# Patient Record
Sex: Male | Born: 1950 | Race: Black or African American | Hispanic: No | Marital: Married
Health system: Southern US, Community
[De-identification: ages and names within clinical notes are randomized; demographics above are authoritative.]

---

## 2009-08-09 ENCOUNTER — Ambulatory Visit: Payer: Self-pay | Admitting: Family Medicine

## 2009-08-09 DIAGNOSIS — S61409A Unspecified open wound of unspecified hand, initial encounter: Secondary | ICD-10-CM | POA: Insufficient documentation

## 2010-07-15 NOTE — Assessment & Plan Note (Signed)
Summary: L Hand laceration x 1 dy proced m   Vital Signs:  Patient Profile:   60 Years Old Male CC:      L Hand - cut by dog's canine 08/08/09 Height:     73 inches Weight:      267 pounds O2 Sat:      100 % O2 treatment:    Room Air Temp:     97.7 degrees F oral Pulse rate:   63 / minute Pulse rhythm:   regular Resp:     16 per minute BP sitting:   168 / 102  (right arm) Cuff size:   large  Vitals Entered By: Areta Haber CMA (August 09, 2009 6:10 PM)                  Current Allergies: No known allergies History of Present Illness Chief Complaint: L Hand - cut by dog's canine 08/08/09 History of Present Illness: Subjective:  Patient complains of dog bite to left hand palm yesterday; dog not ill and has been immunized.  Does not remember last Td.  Current Problems: DOG BITE (ICD-E906.0) LACERATION, HAND, LEFT (ICD-882.0)   Current Meds AUGMENTIN 875-125 MG TABS (AMOXICILLIN-POT CLAVULANATE) One by mouth q12hr pc  REVIEW OF SYSTEMS Constitutional Symptoms      Denies fever, chills, night sweats, weight loss, weight gain, and fatigue.  Eyes       Denies change in vision, eye pain, eye discharge, glasses, contact lenses, and eye surgery. Ear/Nose/Throat/Mouth       Denies hearing loss/aids, change in hearing, ear pain, ear discharge, dizziness, frequent runny nose, frequent nose bleeds, sinus problems, sore throat, hoarseness, and tooth pain or bleeding.  Respiratory       Denies dry cough, productive cough, wheezing, shortness of breath, asthma, bronchitis, and emphysema/COPD.  Cardiovascular       Denies murmurs, chest pain, and tires easily with exhertion.    Gastrointestinal       Denies stomach pain, nausea/vomiting, diarrhea, constipation, blood in bowel movements, and indigestion. Genitourniary       Denies painful urination, kidney stones, and loss of urinary control. Neurological       Denies paralysis, seizures, and  fainting/blackouts. Musculoskeletal       Denies muscle pain, joint pain, joint stiffness, decreased range of motion, redness, swelling, muscle weakness, and gout.  Skin       Denies bruising, unusual mles/lumps or sores, and hair/skin or nail changes.  Psych       Denies mood changes, temper/anger issues, anxiety/stress, speech problems, depression, and sleep problems. Other Comments: Laceration - L hand  x 1 dy - dog ripped at hand going for plastic on laundry.  Pt states that dog has all updated vaccinations including Rabies.   Past History:  Past Medical History: Unremarkable  Past Surgical History: Denies surgical history  Social History: Married Never Smoked Alcohol use-no Drug use-no Regular exercise-no Smoking Status:  never Drug Use:  no Does Patient Exercise:  no   Objective:  No acute distress  Left Hand:  1.5cm simple laceration over thenar eminence.  Wound is clean without debris.  Skin edges remain apposed.  No drainage, swelling, erythema, or tenderness.  Full range of motion thumb and all fingers.  Distal neurovascular intact  Assessment New Problems: DOG BITE (ICD-E906.0) LACERATION, HAND, LEFT (ICD-882.0)  DOG BITE LACERATION, NO EVIDENCE INFECTION  Plan New Medications/Changes: AUGMENTIN 875-125 MG TABS (AMOXICILLIN-POT CLAVULANATE) One by mouth q12hr pc  #14 x  0, 08/09/2009, Donna Christen MD  New Orders: New Patient Level III [phone] Tdap => 35yrs IM [90715] Admin 1st Vaccine [90471] Admin 1st Vaccine [facility]) [phone] Planning Comments:   Tdap given.  Bacitracin and bandage applied to wound.  Begin prophylactic Augmentin.  Apply bacitracin and change bandage daily until healed. Return for signs of infection   The patient and/or caregiver has been counseled thoroughly with regard to medications prescribed including dosage, schedule, interactions, rationale for use, and possible side effects and they verbalize understanding.  Diagnoses and  expected course of recovery discussed and will return if not improved as expected or if the condition worsens. Patient and/or caregiver verbalized understanding.  Prescriptions: AUGMENTIN 875-125 MG TABS (AMOXICILLIN-POT CLAVULANATE) One by mouth q12hr pc  #14 x 0   Entered and Authorized by:   Donna Christen MD   Signed by:   Donna Christen MD on 08/09/2009   Method used:   Print then Give to Patient   RxID:   [id number]    Orders Added: 1)  New Patient Level III [29528] 2)  Tdap => 16yrs IM [90715] 3)  Admin 1st Vaccine [90471] 4)  Admin 1st Vaccine [facility]) [41324M]    Tetanus/Td Vaccine    Vaccine Type: Tdap    Site: left deltoid    Mfr: Gl[employer]oSmith[employer]    Dose: 0.5 ml    Route: IM    Given by: Areta Haber CMA    Exp. Date: 08/10/2011    Lot #: [id number]    VIS given: 05/03/07 version given August 09, 2009.

## 2018-07-14 ENCOUNTER — Other Ambulatory Visit: Payer: Self-pay

## 2018-07-14 ENCOUNTER — Emergency Department: Payer: 59

## 2018-07-14 ENCOUNTER — Emergency Department
Admission: EM | Admit: 2018-07-14 | Discharge: 2018-07-14 | Disposition: A | Discharge status: Other Acute Inpatient Hospital | Payer: 59 | Attending: Emergency Medicine | Admitting: Emergency Medicine

## 2018-07-14 ENCOUNTER — Encounter: Payer: Self-pay | Admitting: Emergency Medicine

## 2018-07-14 DIAGNOSIS — Y999 Unspecified external cause status: Secondary | ICD-10-CM | POA: Diagnosis not present

## 2018-07-14 DIAGNOSIS — Y929 Unspecified place or not applicable: Secondary | ICD-10-CM | POA: Diagnosis not present

## 2018-07-14 DIAGNOSIS — S6992XA Unspecified injury of left wrist, hand and finger(s), initial encounter: Secondary | ICD-10-CM | POA: Diagnosis present

## 2018-07-14 DIAGNOSIS — W278XXA Contact with other nonpowered hand tool, initial encounter: Secondary | ICD-10-CM | POA: Insufficient documentation

## 2018-07-14 DIAGNOSIS — S61211A Laceration without foreign body of left index finger without damage to nail, initial encounter: Secondary | ICD-10-CM | POA: Diagnosis not present

## 2018-07-14 DIAGNOSIS — Y939 Activity, unspecified: Secondary | ICD-10-CM | POA: Diagnosis not present

## 2018-07-14 DIAGNOSIS — S62651B Nondisplaced fracture of medial phalanx of left index finger, initial encounter for open fracture: Secondary | ICD-10-CM | POA: Diagnosis not present

## 2018-07-14 LAB — COMPREHENSIVE METABOLIC PANEL
ALT: 60 U/L — AB (ref 0–44)
AST: 39 U/L (ref 15–41)
Albumin: 4.6 g/dL (ref 3.5–5.0)
Alkaline Phosphatase: 67 U/L (ref 38–126)
Anion gap: 7 (ref 5–15)
BUN: 12 mg/dL (ref 8–23)
CO2: 29 mmol/L (ref 22–32)
CREATININE: 1.17 mg/dL (ref 0.61–1.24)
Calcium: 9.1 mg/dL (ref 8.9–10.3)
Chloride: 101 mmol/L (ref 98–111)
GFR calc Af Amer: >60 mL/min (ref >60)
GFR calc non Af Amer: >60 mL/min (ref >60)
Glucose, Bld: 108 mg/dL — ABNORMAL HIGH (ref 70–99)
Potassium: 3.7 mmol/L (ref 3.5–5.1)
Sodium: 137 mmol/L (ref 135–145)
Total Bilirubin: 0.6 mg/dL (ref 0.3–1.2)
Total Protein: 7.7 g/dL (ref 6.5–8.1)

## 2018-07-14 LAB — CBC WITH DIFFERENTIAL/PLATELET
Abs Immature Granulocytes: 0.02 K/uL (ref 0.00–0.07)
Basophils Absolute: 0.1 K/uL (ref 0.0–0.1)
Basophils Relative: 1 %
Eosinophils Absolute: 0.3 K/uL (ref 0.0–0.5)
Eosinophils Relative: 3 %
HCT: 48.8 % (ref 39.0–52.0)
Hemoglobin: 16.4 g/dL (ref 13.0–17.0)
Immature Granulocytes: 0 %
Lymphocytes Relative: 35 %
Lymphs Abs: 3.3 K/uL (ref 0.7–4.0)
MCH: 29.1 pg (ref 26.0–34.0)
MCHC: 33.6 g/dL (ref 30.0–36.0)
MCV: 86.5 fL (ref 80.0–100.0)
Monocytes Absolute: 0.9 K/uL (ref 0.1–1.0)
Monocytes Relative: 9 %
Neutro Abs: 4.9 K/uL (ref 1.7–7.7)
Neutrophils Relative %: 52 %
Platelets: 280 K/uL (ref 150–400)
RBC: 5.64 MIL/uL (ref 4.22–5.81)
RDW: 13.2 % (ref 11.5–15.5)
WBC: 9.4 K/uL (ref 4.0–10.5)
nRBC: 0 % (ref 0.0–0.2)

## 2018-07-14 MED ORDER — SODIUM CHLORIDE 0.9 % IV BOLUS: 1000.0000 mL | Freq: Once | INTRAVENOUS | Status: DC

## 2018-07-14 MED ORDER — CLINDAMYCIN PHOSPHATE 600 MG/50ML IV SOLN
600.0000 mg | Freq: Once | INTRAVENOUS | Status: AC
  Administered 2018-07-14: 600 mg via INTRAVENOUS
  Filled 2018-07-14: qty 50

## 2018-07-14 MED ORDER — CLINDAMYCIN PHOSPHATE 600 MG/4ML IJ SOLN
600.0000 mg | Freq: Once | INTRAMUSCULAR | Status: DC
  Filled 2018-07-14: qty 4

## 2018-07-14 NOTE — ED Provider Notes (Signed)
Patient with laceration of left hand secondary to ax.  Was accidental.  Patient to be transferred to [facility] hand.  I evaluated the patient and his bleeding appears controlled he is comfortable.  He is understanding the plan as well as diagnosis and willing to comply.   Myrna Blazer, MD 07/14/18 [phone]

## 2018-07-14 NOTE — ED Provider Notes (Signed)
[facility] Emergency Department Provider Note  ____________________________________________  Time seen: Approximately 7:43 PM  I have reviewed the triage vital signs and the nursing notes.   HISTORY  Chief Complaint Extremity Laceration    HPI Dakota Elliott is a 68 y.o. male who presents emergency department complaining of injury to the left index finger from an ax.  Patient was holding a block with attempting to cut it with an ax when he missed striking his hand/finger with a ax blade.  Patient sustained a significant laceration to the digit.  Patient reports he has had uncontrolled bleeding.  He initially presented to urgent care who advised him to come straight to the emergency department.  Patient does have a pressure dressing applied with no bleedthrough.  Patient is able to flex and extend the digit after injury.  Patient's tetanus shot is up-to-date.  No other injury or complaint at this time.    History reviewed. No pertinent past medical history.  Patient Active Problem List   Diagnosis Date Noted  . LACERATION, HAND, LEFT 08/09/2009    History reviewed. No pertinent surgical history.  Prior to Admission medications   Not on File    Allergies Patient has no allergy information on record.  No family history on file.  Social History Social History   Tobacco Use  . Smoking status: Not on file  Substance Use Topics  . Alcohol use: Not on file  . Drug use: Not on file     Review of Systems  Constitutional: No fever/chills Eyes: No visual changes.  Cardiovascular: no chest pain. Respiratory: no cough. No SOB. Gastrointestinal: No abdominal pain.  No nausea, no vomiting.  Musculoskeletal: Injury to left index finger from an ax Skin: Negative for rash, abrasions, lacerations, ecchymosis. Neurological: Negative for headaches, focal weakness or numbness. 10-point ROS otherwise  negative.  ____________________________________________   PHYSICAL EXAM:  VITAL SIGNS: ED Triage Vitals  Enc Vitals Group     BP 07/14/18 1922 (!) 190/105     Pulse Rate 07/14/18 1920 81     Resp 07/14/18 1920 18     Temp 07/14/18 1920 98.2 F (36.8 C)     Temp Source 07/14/18 1920 Oral     SpO2 07/14/18 1920 98 %     Weight 07/14/18 1918 250 lb (113.4 kg)     Height 07/14/18 1918 6\' 1"  (1.854 m)     Head Circumference --      Peak Flow --      Pain Score 07/14/18 1929 2     Pain Loc --      Pain Edu? --      Excl. in GC? --      Constitutional: Alert and oriented. Well appearing and in no acute distress. Eyes: Conjunctivae are normal. PERRL. EOMI. Head: Atraumatic.  Neck: No stridor.    Cardiovascular: Normal rate, regular rhythm. Normal S1 and S2.  Good peripheral circulation. Respiratory: Normal respiratory effort without tachypnea or retractions. Lungs CTAB. Good air entry to the bases with no decreased or absent breath sounds. Musculoskeletal: Full range of motion to all extremities. No gross deformities appreciated.  Visualization of the left hand reveals significant soft tissue injury to the lateral aspect of the index finger.  Extensor tendon is not visualized, bony structure is visualized.  On exam, patient is missing epithelial and subcutaneous tissue on the proximal aspect of the digit.  Patient does have a flap laceration at this time, however, flap does not cover  entire surface area.  Patient is able to flex and extend the digit appropriately at this time.  Heavy, apparent arterial bleeding is present.  This is able to be controlled with finger tourniquet.  Sensation intact distally. Neurologic:  Normal speech and language. No gross focal neurologic deficits are appreciated.  Skin:  Skin is warm, dry and intact. No rash noted. Psychiatric: Mood and affect are normal. Speech and behavior are normal. Patient exhibits appropriate insight and  judgement.     ____________________________________________   LABS (all labs ordered are listed, but only abnormal results are displayed)  Labs Reviewed  COMPREHENSIVE METABOLIC PANEL - Abnormal; Notable for the following components:      Result Value   Glucose, Bld 108 (*)    ALT 60 (*)    All other components within normal limits  CBC WITH DIFFERENTIAL/PLATELET   ____________________________________________  EKG   ____________________________________________  RADIOLOGY I personally viewed and evaluated these images as part of my medical decision making, as well as reviewing the written report by the radiologist.  I concur with radiologist finding.  Dg Hand Complete Left  Result Date: 07/14/2018 CLINICAL DATA:  Patient struck left index finger with an Axe. Bandage is present. EXAM: LEFT HAND - COMPLETE 3+ VIEW COMPARISON:  None. FINDINGS: Comminuted mostly longitudinal crush fractures of the proximal and midshaft of the middle phalanx left second finger. Fracture lines extend to the articular surface at the proximal interphalangeal joint. Minimal displacement of fracture fragments. Associated soft tissue swelling. Degenerative changes in the interphalangeal joints, first metacarpal phalangeal joint, and STT joints. No expansile or destructive bone lesions. IMPRESSION: Comminuted fractures of the middle phalanx of the left second finger with extension to the proximal interphalangeal joint. Electronically Signed   By: Burman Nieves M.D.   On: 07/14/2018 20:12    ____________________________________________    PROCEDURES  Procedure(s) performed:    Procedures    Medications  sodium chloride 0.9 % bolus 1,000 mL (1,000 mLs Intravenous Bolus 07/14/18 2018)  clindamycin (CLEOCIN) IVPB 600 mg (0 mg Intravenous Stopped 07/14/18 2047)     ____________________________________________   INITIAL IMPRESSION / ASSESSMENT AND PLAN / ED COURSE  Pertinent labs & imaging  results that were available during my care of the patient were reviewed by me and considered in my medical decision making (see chart for details).  Review of the [place] CSRS was performed in accordance of the [facility] prior to dispensing any controlled drugs.      Patient's diagnosis is consistent with laceration with open nondisplaced fracture of the middle phalanx to the second digit of the left hand.  Patient presented to the emergency department after striking his hand with an ax.  Patient sustained a significant soft tissue injury to the second digit.  There was missing tissue identified on exam.  Patient did have an arterial injury but this was controlled direct pressure and elevation.  Given nature of injury, I did discuss the case with on-call orthopedic surgeon who advises that he is not able to manage this and he needed hand surgery.  I contacted [facility] and they are agreeable to transfer for hand surgery.  Patient will be transported by EMS for ED to ED transfer for hand surgery evaluation.  Patient was given IV clindamycin prophylactically.  Patient is kept n.p.o. at this time.  Wound is dressed and stable prior to transfer.  Patient did have bleedthrough of the first dressing placed in the emergency department.  Dressing was reapplied  with no bleedthrough on the pressure dressing at time of transfer..  Patient care will be transferred to [facility] for transfer to [facility] emergency department.    ____________________________________________  FINAL CLINICAL IMPRESSION(S) / ED DIAGNOSES  Final diagnoses:  Laceration of left index finger without foreign body without damage to nail, initial encounter  Open nondisplaced fracture of middle phalanx of left index finger, initial encounter      NEW MEDICATIONS STARTED DURING THIS VISIT:  ED Discharge Orders    None          This chart was dictated using voice recognition  software/Dragon. Despite best efforts to proofread, errors can occur which can change the meaning. Any change was purely unintentional.    Racheal Patches, PA-C 07/14/18 2131    Myrna Blazer, MD 07/14/18 [phone]

## 2018-07-14 NOTE — ED Notes (Signed)
Pt resting on stretcher with family at the bedside. Has no complaints at this time. Pt has been informed that he will be transferred to [facility] and that he is waiting for transport. Verbalized understanding.

## 2018-07-14 NOTE — ED Notes (Signed)
Pt cut left index finger with axe.

## 2018-07-14 NOTE — ED Notes (Signed)
Pt up to restroom with steady gait. No distress noted.  

## 2018-07-14 NOTE — ED Triage Notes (Addendum)
Pt arrived with concerns over laceration to left finger. Bandaged currently over finger and bleeding contained. Family and pt ask for dressing not to be removed until with a provider. Bleeding contained with bandage

## 2018-07-14 NOTE — ED Notes (Signed)
Accepted to [facility] (ED>ED)

## 2018-07-14 NOTE — ED Notes (Signed)
[facility] reviewed , paper consent for transfer completed

## 2020-06-13 IMAGING — CR DG HAND COMPLETE 3+V*L*
1 series · 3 of 3 positions shown · non-contrast
Comparison: None.

CLINICAL DATA: Patient struck left index finger with an Axe.
Bandage is present.

EXAM:
LEFT HAND - COMPLETE 3+ VIEW

[Series 1: x hand pa left · 0.14mm/px · 3 of 3 slices shown]
[im 1/3]
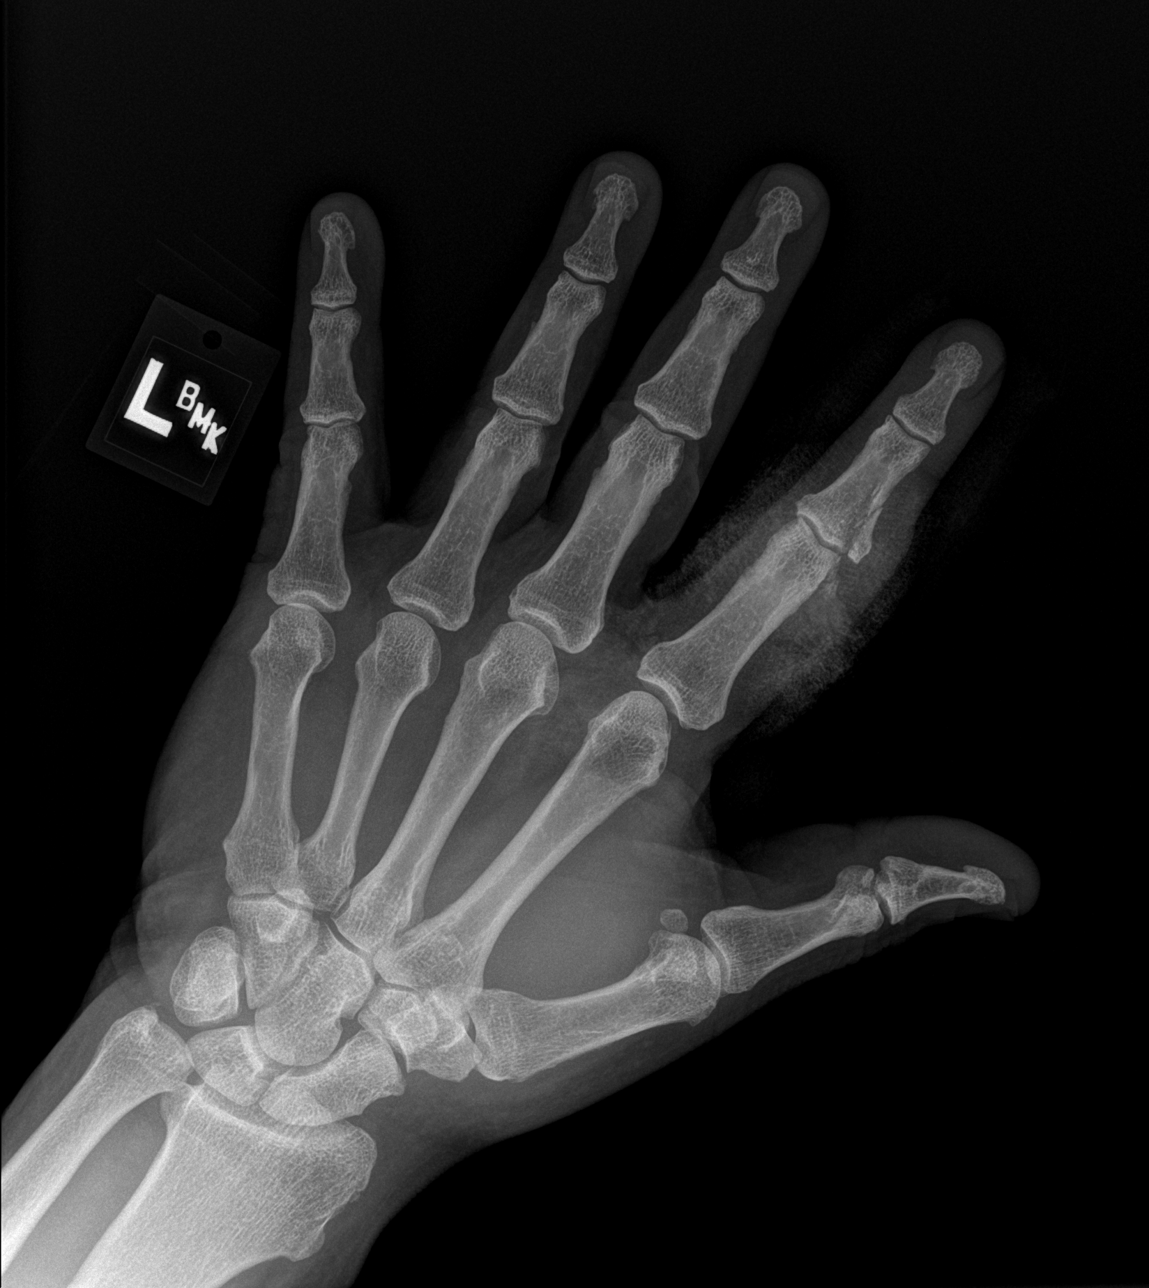
[im 2/3]
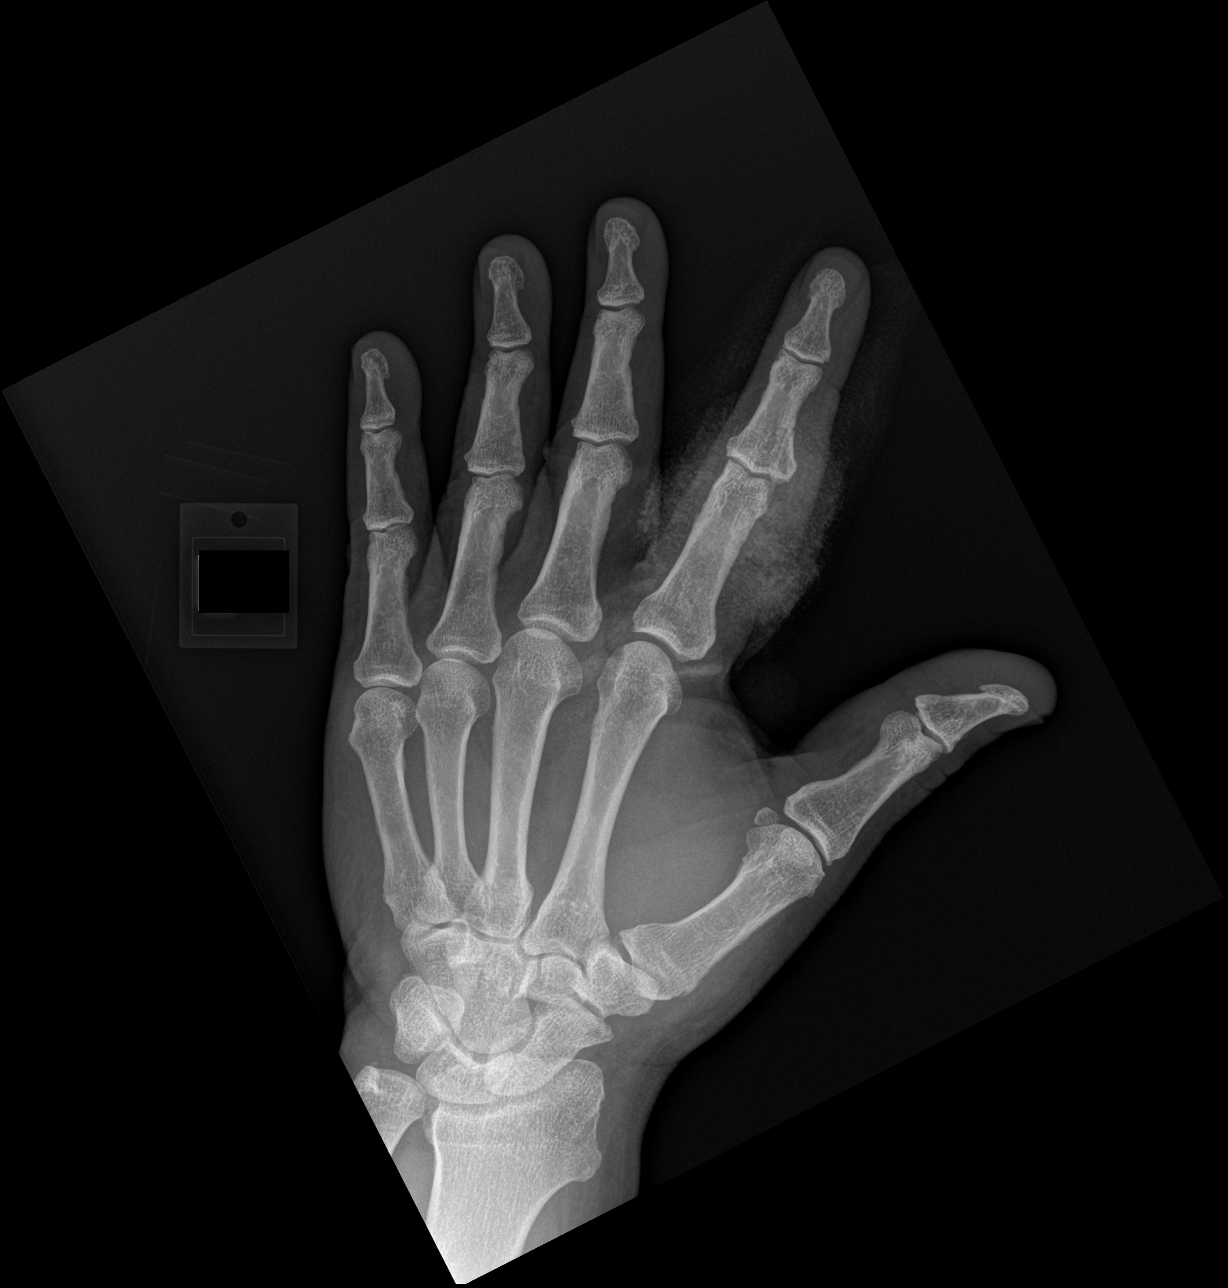
[im 3/3]
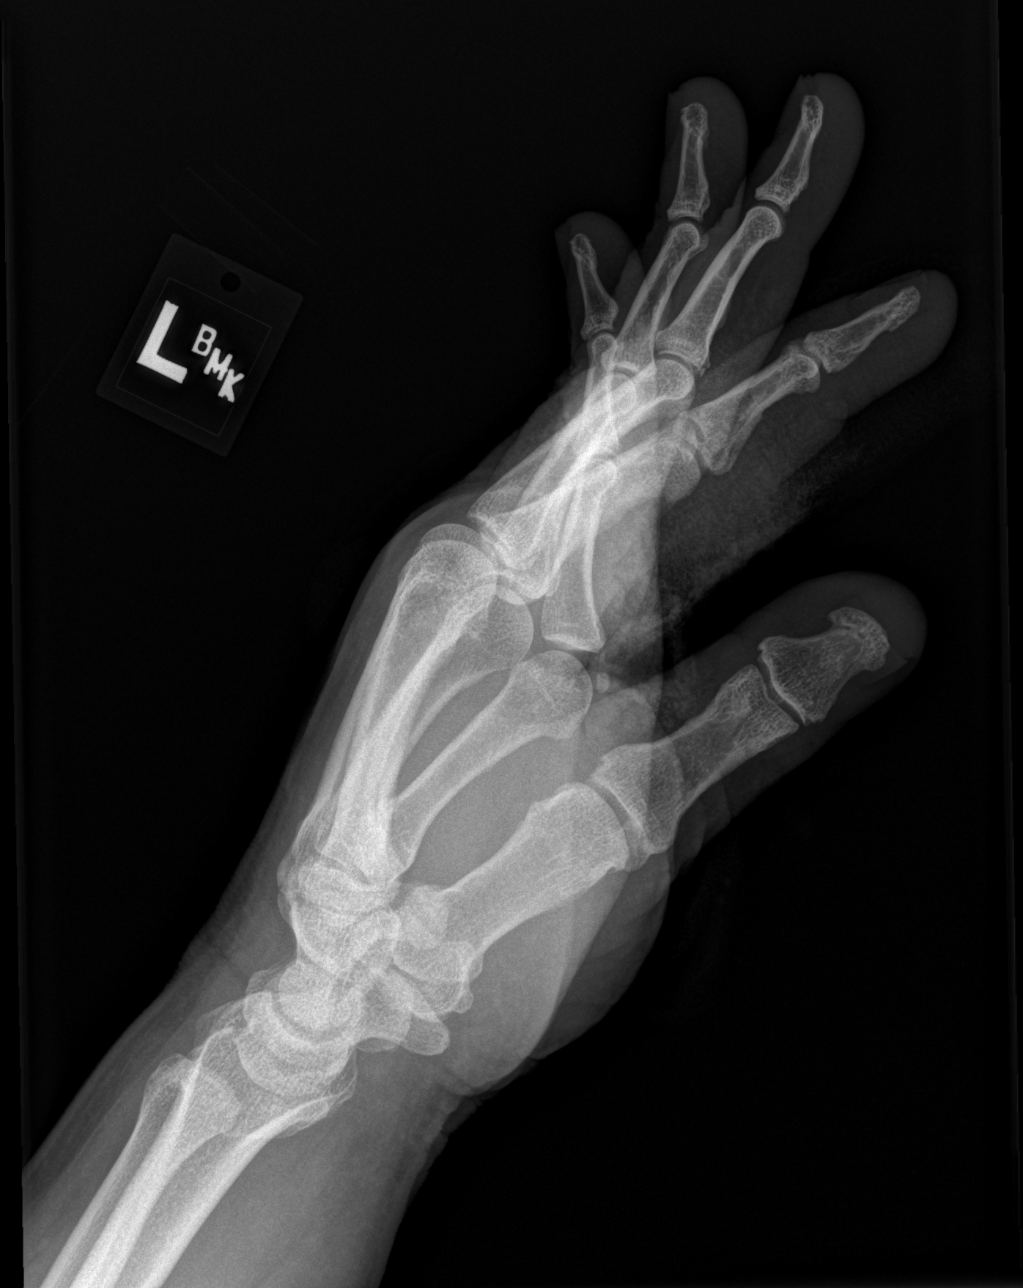

[3 of 3 positions shown; findings below may reference images not displayed]

FINDINGS: Comminuted mostly longitudinal crush fractures of the proximal and
midshaft of the middle phalanx left second finger. Fracture lines
extend to the articular surface at the proximal interphalangeal
joint. Minimal displacement of fracture fragments. Associated soft
tissue swelling. Degenerative changes in the interphalangeal joints,
first metacarpal phalangeal joint, and STT joints. No expansile or
destructive bone lesions.
IMPRESSION: Comminuted fractures of the middle phalanx of the left second finger
with extension to the proximal interphalangeal joint.
